# Patient Record
Sex: Female | Born: 1990
Health system: Southern US, Community
[De-identification: ages and names within clinical notes are randomized; demographics above are authoritative.]

## PROBLEM LIST (undated history)

## (undated) DIAGNOSIS — R011 Cardiac murmur, unspecified: Secondary | ICD-10-CM

## (undated) DIAGNOSIS — I4891 Unspecified atrial fibrillation: Secondary | ICD-10-CM

---
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
## 2011-01-17 LAB — CBC
Hemoglobin: 9.8 g/dL — ABNORMAL LOW (ref 12.0–16.0)
RBC: 4.89 MIL/uL (ref 3.80–5.70)
WBC: 7 10*3/uL (ref 4.5–13.5)

## 2011-01-17 LAB — RAPID URINE DRUG SCREEN, HOSP PERFORMED
Amphetamines: NOT DETECTED
Benzodiazepines: NOT DETECTED

## 2011-01-17 LAB — URINALYSIS, ROUTINE W REFLEX MICROSCOPIC
Hgb urine dipstick: NEGATIVE
Specific Gravity, Urine: 1.013 (ref 1.005–1.030)
Urobilinogen, UA: 1 mg/dL (ref 0.0–1.0)
pH: 6.5 (ref 5.0–8.0)

## 2011-01-17 LAB — BASIC METABOLIC PANEL
Calcium: 9.3 mg/dL (ref 8.4–10.5)
Chloride: 103 mEq/L (ref 96–112)
Creatinine, Ser: 0.66 mg/dL (ref 0.4–1.2)
Sodium: 137 mEq/L (ref 135–145)

## 2011-01-17 LAB — DIFFERENTIAL
Lymphs Abs: 1.9 10*3/uL (ref 1.1–4.8)
Monocytes Relative: 6 % (ref 3–11)
Neutro Abs: 4.6 10*3/uL (ref 1.7–8.0)
Neutrophils Relative %: 66 % (ref 43–71)

## 2011-01-17 LAB — ETHANOL: Alcohol, Ethyl (B): <5 mg/dL (ref 0–10)

## 2011-01-17 LAB — URINE MICROSCOPIC-ADD ON

## 2011-01-17 LAB — PREGNANCY, URINE: Preg Test, Ur: POSITIVE

## 2015-05-10 ENCOUNTER — Emergency Department (HOSPITAL_COMMUNITY)
Admission: EM | Admit: 2015-05-10 | Discharge: 2015-05-10 | Disposition: A | Discharge status: Home / Self Care | Payer: Medicaid Other | Attending: Emergency Medicine | Admitting: Emergency Medicine

## 2015-05-10 ENCOUNTER — Encounter (HOSPITAL_COMMUNITY): Payer: Self-pay | Admitting: Nurse Practitioner

## 2015-05-10 DIAGNOSIS — R509 Fever, unspecified: Secondary | ICD-10-CM | POA: Insufficient documentation

## 2015-05-10 DIAGNOSIS — Z720 Tobacco use: Secondary | ICD-10-CM | POA: Insufficient documentation

## 2015-05-10 DIAGNOSIS — K0889 Other specified disorders of teeth and supporting structures: Secondary | ICD-10-CM

## 2015-05-10 DIAGNOSIS — K029 Dental caries, unspecified: Secondary | ICD-10-CM | POA: Insufficient documentation

## 2015-05-10 DIAGNOSIS — Z87828 Personal history of other (healed) physical injury and trauma: Secondary | ICD-10-CM | POA: Insufficient documentation

## 2015-05-10 DIAGNOSIS — Z8679 Personal history of other diseases of the circulatory system: Secondary | ICD-10-CM | POA: Diagnosis not present

## 2015-05-10 DIAGNOSIS — R110 Nausea: Secondary | ICD-10-CM | POA: Insufficient documentation

## 2015-05-10 DIAGNOSIS — R011 Cardiac murmur, unspecified: Secondary | ICD-10-CM | POA: Diagnosis not present

## 2015-05-10 DIAGNOSIS — K088 Other specified disorders of teeth and supporting structures: Secondary | ICD-10-CM | POA: Insufficient documentation

## 2015-05-10 HISTORY — DX: Unspecified atrial fibrillation: I48.91

## 2015-05-10 HISTORY — DX: Cardiac murmur, unspecified: R01.1

## 2015-05-10 MED ORDER — IBUPROFEN 800 MG PO TABS: 800.0000 mg | ORAL_TABLET | Freq: Three times a day (TID) | ORAL | Status: DC

## 2015-05-10 MED ORDER — PENICILLIN V POTASSIUM 500 MG PO TABS: 500.0000 mg | ORAL_TABLET | Freq: Four times a day (QID) | ORAL | Status: AC

## 2015-05-10 NOTE — ED Provider Notes (Signed)
CSN: [id number]     Arrival date & time 05/10/15  1209 History  This chart was scribed for non-physician practitioner, Jinny Sanders, PA-C working with Nelva Nay, MD by Gwenyth Ober, ED scribe. This patient was seen in room TR05C/TR05C and the patient's care was started at 1:56 PM   Chief Complaint  Patient presents with  . Dental Pain   The history is provided by the patient. No language interpreter was used.   HPI Comments: Anne Jones is a 24 y.o. female who presents to the Emergency Department complaining of constant, moderate, gradually worsening right upper molar pain that started 1 week ago. Pt states swelling of her right face, fever of 100 and nausea as associated symptoms. She reports pain becomes worse with closing her mouth. Pt has tried Tylenol, Midol and Ibuprofen with no relief. Pt has a history of fracture of that same tooth. She has never been to a dentist. Pt denies vomiting.  Past Medical History  Diagnosis Date  . Atrial fibrillation   . Heart murmur    History reviewed. No pertinent past surgical history. History reviewed. No pertinent family history. History  Substance Use Topics  . Smoking status: Current Every Day Smoker    Types: Cigarettes  . Smokeless tobacco: Not on file  . Alcohol Use: Yes   OB History    No data available     Review of Systems  Constitutional: Positive for fever.  HENT: Positive for dental problem and facial swelling.   Gastrointestinal: Positive for nausea. Negative for vomiting.    Allergies  Review of patient's allergies indicates no known allergies.  Home Medications   Prior to Admission medications   Medication Sig Start Date End Date Taking? Authorizing Provider  ibuprofen (ADVIL,MOTRIN) 800 MG tablet Take 1 tablet (800 mg total) by mouth 3 (three) times daily. 05/10/15   Ladona Mow, PA-C  penicillin v potassium (VEETID) 500 MG tablet Take 1 tablet (500 mg total) by mouth 4 (four) times daily. 05/10/15 05/17/15   Ladona Mow, PA-C   BP 113/60 mmHg  Pulse 71  Temp(Src) 98.4 F (36.9 C) (Oral)  Resp 18  Ht 5\' 4"  (1.626 m)  Wt 213 lb 4.8 oz (96.752 kg)  BMI 36.59 kg/m2  SpO2 100% Physical Exam  Constitutional: She appears well-developed and well-nourished. No distress.  HENT:  Head: Normocephalic and atraumatic.  Mouth/Throat: Uvula is midline, oropharynx is clear and moist and mucous membranes are normal. No trismus in the jaw. Dental caries present. No dental abscesses. No oropharyngeal exudate, posterior oropharyngeal edema, posterior oropharyngeal erythema or tonsillar abscesses.  Upper rear right molar with dental caries; no obvious abscess; gumline intact; floor of mouth is soft. Mild anterior cervical lymphadenopathy.   Eyes: Conjunctivae and EOM are normal.  Neck: Normal range of motion and full passive range of motion without pain. Neck supple. No spinous process tenderness and no muscular tenderness present. No rigidity. No tracheal deviation, no edema, no erythema and normal range of motion present. No Brudzinski's sign and no Kernig's sign noted.  Cardiovascular: Normal rate.   Pulmonary/Chest: Effort normal. No respiratory distress.  Lymphadenopathy:    She has cervical adenopathy (mild).  Skin: Skin is warm and dry.  Psychiatric: She has a normal mood and affect. Her behavior is normal.  Nursing note and vitals reviewed.   ED Course  Dental Date/Time: 05/10/2015 5:21 PM Performed by: Ladona Mow Authorized by: Ladona Mow Consent: Verbal consent obtained. Risks and benefits: risks, benefits and alternatives were  discussed Consent given by: patient Patient identity confirmed: verbally with patient Time out: Immediately prior to procedure a "time out" was called to verify the correct patient, procedure, equipment, support staff and site/side marked as required. Preparation: Patient was prepped and draped in the usual sterile fashion. Local anesthesia used: yes Local anesthetic:  bupivacaine 0.5% with epinephrine Anesthetic total: 1.8 ml Patient sedated: no Patient tolerance: Patient tolerated the procedure well with no immediate complications Comments: Dental block to posterior upper right molar. Local anesthesia was used to anesthetize the nerve root directly over the rear molar. Patient experienced immediate relief status post.     DIAGNOSTIC STUDIES: Oxygen Saturation is 100% on RA, normal by my interpretation.    COORDINATION OF CARE: 1:17 PM Discussed treatment plan with pt which includes dental block. Pt agreed to plan.   2:06 PM Performed dental block. Pt tolerated procedure well. Discussed discharge instructions including prescription for Motrin and antibiotic. No other concerns at this time.     Labs Review Labs Reviewed - No data to display  Imaging Review No results found.   EKG Interpretation None      MDM   Final diagnoses:  Pain, dental   Patient with toothache.  Patient afebrile, hemodynamically stable, well-appearing and in no acute distress. No gross abscess.  Exam unconcerning for Ludwig's angina or spread of infection.  Dental block as in procedure note. Will treat with penicillin and pain medicine.  Urged patient to follow-up with dentist.  Discussed return precautions with patient, patient verbalizes understanding and agreement of this plan.   I personally performed the services described in this documentation, which was scribed in my presence. The recorded information has been reviewed and is accurate.  BP 113/60 mmHg  Pulse 71  Temp(Src) 98.4 F (36.9 C) (Oral)  Resp 18  Ht  (1.626 m)  Wt 213 lb 4.8 oz (96.752 kg)  BMI 36.59 kg/m2  SpO2 100%  Signed,  Ladona Mow, PA-C 5:23 PM    Ladona Mow, PA-C 05/10/15 1723  Nelva Nay, MD 05/11/15 [phone]

## 2015-05-10 NOTE — ED Notes (Signed)
Pt c/o R upper dental pain x 6 days.she tried tylenol with no relief. She states she can not sleep due to pain.

## 2015-05-10 NOTE — Discharge Instructions (Signed)
Dental Pain °A tooth ache may be caused by cavities (tooth decay). Cavities expose the nerve of the tooth to air and hot or cold temperatures. It may come from an infection or abscess (also called a boil or furuncle) around your tooth. It is also often caused by dental caries (tooth decay). This causes the pain you are having. °DIAGNOSIS  °Your caregiver can diagnose this problem by exam. °TREATMENT  °· If caused by an infection, it may be treated with medications which kill germs (antibiotics) and pain medications as prescribed by your caregiver. Take medications as directed. °· Only take over-the-counter or prescription medicines for pain, discomfort, or fever as directed by your caregiver. °· Whether the tooth ache today is caused by infection or dental disease, you should see your dentist as soon as possible for further care. °SEEK MEDICAL CARE IF: °The exam and treatment you received today has been provided on an emergency basis only. This is not a substitute for complete medical or dental care. If your problem worsens or new problems (symptoms) appear, and you are unable to meet with your dentist, call or return to this location. °SEEK IMMEDIATE MEDICAL CARE IF:  °· You have a fever. °· You develop redness and swelling of your face, jaw, or neck. °· You are unable to open your mouth. °· You have severe pain uncontrolled by pain medicine. °MAKE SURE YOU:  °· Understand these instructions. °· Will watch your condition. °· Will get help right away if you are not doing well or get worse. °Document Released: 09/29/2005 Document Revised: 12/22/2011 Document Reviewed: 05/17/2008 °ExitCare® Patient Information ©2015 [employer]. This information is not intended to replace advice given to you by your health care provider. Make sure you discuss any questions you have with your health care provider. ° °Emergency Department Resource Guide °1) Find a Doctor and Pay Out of Pocket °Although you won't have to find out who  is covered by your insurance plan, it is a good idea to ask around and get recommendations. You will then need to call the office and see if the doctor you have chosen will accept you as a new patient and what types of options they offer for patients who are self-pay. Some doctors offer discounts or will set up payment plans for their patients who do not have insurance, but you will need to ask so you aren't surprised when you get to your appointment. ° °2) Contact Your Local Health Department °Not all health departments have doctors that can see patients for sick visits, but many do, so it is worth a call to see if yours does. If you don't know where your local health department is, you can check in your phone book. The CDC also has a tool to help you locate your state's health department, and many state websites also have listings of all of their local health departments. ° °3) Find a Walk-in Clinic °If your illness is not likely to be very severe or complicated, you may want to try a walk in clinic. These are popping up all over the country in pharmacies, drugstores, and shopping centers. They're usually staffed by nurse practitioners or physician assistants that have been trained to treat common illnesses and complaints. They're usually fairly quick and inexpensive. However, if you have serious medical issues or chronic medical problems, these are probably not your best option. ° °No Primary Care Doctor: °- Call [facility] at  832-8000 - they can help you locate a primary   care doctor that  accepts your insurance, provides certain services, etc. °- Physician Referral Service- 1-800-533-3463 ° °Chronic Pain Problems: °Organization         Address  Phone   Notes  °[facility]  (336) 297-2271 Patients need to be referred by their primary care doctor.  ° °Medication Assistance: °Organization         Address  Phone   Notes  °[place] [facility] [address]  [address] °[place], [place] [address] (336) 641-8030 --Must be a resident of [place] °-- Must have NO insurance coverage whatsoever (no Medicaid/ Medicare, etc.) °-- The pt. MUST have a primary care doctor that directs their care regularly and follows them in the community °  °[facility]  (866) 331-1348   °[employer]  (888) 892-1162   ° °Agencies that provide inexpensive medical care: °Organization         Address  Phone   Notes  °[facility]  (336) 832-8035   °[facility]    (336) 832-7272   °[facility] [address] °[place], [place] [address] (336) 832-4777   °[facility] [address], °[place] (336) 271-4999   °[facility]    (336) 373-0678   °[facility]    (336) 272-1050   °[facility] ° [address], [place] Phone:  (336) 832-4444, Fax:  (336) 832-4440 Hours of Operation:  9 am - 6 pm, M-F.  Also accepts Medicaid/Medicare and self-pay.  °[facility] ° [address], [place] Phone: (336) 832-3150, Fax: (336) 832-3151. Hours of Operation:  8:30 am - 5:30 pm, M-F.  Also accepts Medicaid and self-pay.  °[facility] [place] [address], [place] Phone: (336) 878-6027   °[facility] [address], [place], [place] (336)723-1848, Ext. 123 Mondays & Thursdays: 7-9 AM.  First 15 patients are seen on a first come, first serve basis. °  ° °Medicaid-accepting [place] Providers: ° °Organization         Address  Phone   Notes  °[facility] [address], [place] (336) 641-2100 Also accepts self-pay patients.  °[facility] [address], [place] ° (336) 856-9996   °[facility] [address], [place] (336) 288-8857   °[facility] [address] [place] [address], [place] (336) 299-7000   °Veita Bland [address], [place]  ° (336) 373-1557 Only accepts  Access Medicaid patients after they have their name applied to their card.  ° °Self-Pay (no insurance) in [place]: ° °Organization         Address  Phone   Notes  °Sickle Cell Patients, [facility] [address], [place] (336) 832-1970   °[facility] [address], [place] (336) 832-4400   °[facility] ° [address], [place] (336) 992-4800   °[facility]/Dr. Osei-Bonsu ° [address] [place] [address], [place] or [address], [place] (336) [phone] Phone number for both [place] and [place] locations is the same.  °[facility] [address], [place] (336) 299-0000   °[facility] [address] [place] [address],  or [address] (336) 852-7530 °(336) 878-2260   °  [facility] [address], [place] (336) 350-1642, phone; (336) 294-5005, fax Sees patients 1st and 3rd Saturday of every month.  Must not qualify for public or private insurance (i.e. Medicaid, Medicare, [place] Health Choice, Veterans' Benefits) • Household income should be no more than 200% of the poverty level •The clinic cannot treat you if you are pregnant or think you are pregnant • Sexually transmitted diseases are not treated at the clinic.  ° ° °Dental Care: °Organization         Address  Phone  Notes  °[facility] [address], [place] (336) 641-6152 Accepts children up to age 21 who are enrolled in Medicaid or [place] Health Choice; pregnant women with a Medicaid card; and children who have applied for Medicaid or [place] Health Choice, but were declined, whose parents can pay a reduced fee at time of service.  °[facility] [place]  [address], [place] (336) 641-7733 Accepts children up to age 21 who are  enrolled in Medicaid or [place] Health Choice; pregnant women with a Medicaid card; and children who have applied for Medicaid or [place] Health Choice, but were declined, whose parents can pay a reduced fee at time of service.  °[facility] ° [address], [place] (336) 641-4533 Patients are seen by appointment only. Walk-ins are not accepted. [facility] will see patients 18 years of age and older. °Monday - Tuesday (8am-5pm) °Most Wednesdays (8:30-5pm) °$30 per visit, cash only  °[facility] ° [address], [place] (336) 641-4533 Patients are seen by appointment only. Walk-ins are not accepted. [facility] will see patients 18 years of age and older. °One Wednesday Evening (Monthly: Volunteer Based).  $30 per visit, cash only  °[facility]  (919) 537-3737 for adults; Children under age 4, call Graduate Pediatric Dentistry at ([phone]) [phone]. Children aged 4-14, please call (919) [phone] to request a pediatric application. ° Dental services are provided in all areas of dental care including fillings, crowns and bridges, complete and partial dentures, implants, gum treatment, root canals, and extractions. Preventive care is also provided. Treatment is provided to both adults and children. °Patients are selected via a lottery and there is often a waiting list. °  °[facility] [address], °[place] ° (336) 763-8833 [email or web address] °  °[facility] [address], [place], [place] (336)723-1848, Ext. 123 Second and Fourth Thursday of each month, opens at 6:30 AM; Clinic ends at 9 AM.  Patients are seen on a first-come first-served basis, and a limited number are seen during each clinic.  ° °[facility] ° [address], [place], [place] (336) 723-7904   Eligibility Requirements °You must have lived in [place], or [place] counties for at least the last three months. °  You  cannot be eligible for state or federal sponsored healthcare insurance, including Veterans Administration, Medicaid, or Medicare. °  You generally cannot be eligible for healthcare insurance through your employer.  °  How to apply: °Eligibility screenings are held every Tuesday and Wednesday afternoon from 1:00 pm until 4:00 pm. You do not need an appointment for the interview!  °[facility] [address], [place], [place] 336-631-2330   °[facility]  336-342-8273   °[facility]  336-703-3100   °[facility]  336-570-6415   ° °Behavioral Health Resources in the Community: °Intensive Outpatient Programs °Organization         Address  Phone  Notes  °[facility] [address], [place], [place] 336-878-6098   °[facility] [address], [place], [place] 336-832-9800   °[facility] [address], [place], [place] ° 336-882-2125   °[facility] [address],  °[place], [place] 1-800-853-5163 or 336-641-4981   °Substance Abuse Resources °Organization         Address  Phone  Notes  °[facility]  336-882-2125   °[facility]  336-784-9470   °[facility]  336-285-9073   °[facility]  336-845-3988   °Residential & Outpatient Substance Abuse Program  1-800-659-3381   °Psychological Services °Organization         Address  Phone  Notes  °[facility]  336- 832-9600   °[facility]  336- 378-7881   °[facility] [address], [place] 1-800-853-5163 or 336-641-4981   ° °Mobile Crisis Teams °Organization         Address  Phone  Notes  °[facility], Mobile Crisis Care Unit  1-877-626-1772   °Assertive °[facility] ° [address] [place], [place] 336-834-9664   °Sharon DeEsch [address] °[place] 336-554-5454   ° °Self-Help/Support  Groups °Organization         Address  Phone             Notes  °[facility] - variety of support groups  336- 373-1402 Call for more information  °Narcotics Anonymous (NA), [facility] [address], °[place]  2 meetings at this location  ° °Residential Treatment Programs °Organization         Address  Phone  Notes  °[facility] [address],    °[place]  1-866-801-8205   °[facility] ° [address], [place], [place] 704-293-8524   °[facility] [address], [place] 336-845-3988 Admissions: 8am-3pm M-F  °[facility] [address]    °[place], [place] 336-841-1104   °[facility] [address], [place], [place] 336-379-7146   °[facility] [address]  °[place], [place] 336-285-9073   °[facility] Intensive Outpatient [address], [place], [place] 336-852-3033   °[facility] [address]  °[place], [place] 1-877-615-2722 or 336-784-9470   °[facility] [address] [place], [place] 336-227-7417 Accepts Medicaid  °[facility] [address]  ° [place] 1-800-659-3381 Substance Abuse/Addiction Treatment  ° °[place] Behavioral Health Resources °Organization         Address  Phone  Notes  °[facility]  (888) 581-9988   °Julie Brannon, PhD [address] [place], [place]   (336) 349-5553 or (336) 951-0000   °[facility]   [address] °[place], [place] (336) 349-4454   °[facility] [address], [place],  (336) 342-8316 Insurance/Medicaid/sponsorship through [facility]  °[facility] [address]                                    [place],  (336) 342-8316 Therapy/tele-psych/case  °[facility]   [address]  ° [place], [place] (336) 349-2233    °Dr. Arfeen  (336) 349-4544   °[facility] [place]  [employer] [facility] [place] [facility] 1) [address], [place] °2) [address], [place] °3)  [address], [place] (336) 349-3220 °(336) 342-7768 ° °(336) 342-8140   °[place] [facility] (336) 342-1394 or (336) 342-3537 (After Hours)    ° ° ° °

## 2019-02-12 ENCOUNTER — Inpatient Hospital Stay (HOSPITAL_COMMUNITY)
Admission: EM | Admit: 2019-02-12 | Discharge: 2019-02-15 | DRG: 517 | Disposition: A | Discharge status: Home Health | Payer: Self-pay | Attending: Orthopedic Surgery | Admitting: Orthopedic Surgery

## 2019-02-12 ENCOUNTER — Emergency Department (HOSPITAL_COMMUNITY): Payer: Self-pay

## 2019-02-12 ENCOUNTER — Other Ambulatory Visit: Payer: Self-pay

## 2019-02-12 DIAGNOSIS — S32592A Other specified fracture of left pubis, initial encounter for closed fracture: Secondary | ICD-10-CM

## 2019-02-12 DIAGNOSIS — Z791 Long term (current) use of non-steroidal anti-inflammatories (NSAID): Secondary | ICD-10-CM

## 2019-02-12 DIAGNOSIS — S32511A Fracture of superior rim of right pubis, initial encounter for closed fracture: Secondary | ICD-10-CM

## 2019-02-12 DIAGNOSIS — Y92411 Interstate highway as the place of occurrence of the external cause: Secondary | ICD-10-CM

## 2019-02-12 DIAGNOSIS — Z419 Encounter for procedure for purposes other than remedying health state, unspecified: Secondary | ICD-10-CM

## 2019-02-12 DIAGNOSIS — T148XXA Other injury of unspecified body region, initial encounter: Secondary | ICD-10-CM

## 2019-02-12 DIAGNOSIS — S329XXA Fracture of unspecified parts of lumbosacral spine and pelvis, initial encounter for closed fracture: Secondary | ICD-10-CM | POA: Diagnosis present

## 2019-02-12 DIAGNOSIS — S32811A Multiple fractures of pelvis with unstable disruption of pelvic ring, initial encounter for closed fracture: Principal | ICD-10-CM | POA: Diagnosis present

## 2019-02-12 DIAGNOSIS — S32810A Multiple fractures of pelvis with stable disruption of pelvic ring, initial encounter for closed fracture: Secondary | ICD-10-CM

## 2019-02-12 DIAGNOSIS — S32512A Fracture of superior rim of left pubis, initial encounter for closed fracture: Secondary | ICD-10-CM

## 2019-02-12 DIAGNOSIS — F17210 Nicotine dependence, cigarettes, uncomplicated: Secondary | ICD-10-CM | POA: Diagnosis present

## 2019-02-12 DIAGNOSIS — Z20828 Contact with and (suspected) exposure to other viral communicable diseases: Secondary | ICD-10-CM

## 2019-02-12 DIAGNOSIS — F1290 Cannabis use, unspecified, uncomplicated: Secondary | ICD-10-CM | POA: Diagnosis present

## 2019-02-12 DIAGNOSIS — F4310 Post-traumatic stress disorder, unspecified: Secondary | ICD-10-CM | POA: Diagnosis present

## 2019-02-12 DIAGNOSIS — Z1159 Encounter for screening for other viral diseases: Secondary | ICD-10-CM

## 2019-02-12 LAB — COMPREHENSIVE METABOLIC PANEL
ALT: 30 U/L (ref 0–44)
AST: 75 U/L — ABNORMAL HIGH (ref 15–41)
Albumin: 4.1 g/dL (ref 3.5–5.0)
Alkaline Phosphatase: 65 U/L (ref 38–126)
Anion gap: 9 (ref 5–15)
BUN: 6 mg/dL (ref 6–20)
CO2: 25 mmol/L (ref 22–32)
Calcium: 9.3 mg/dL (ref 8.9–10.3)
Chloride: 100 mmol/L (ref 98–111)
Creatinine, Ser: 1.36 mg/dL — ABNORMAL HIGH (ref 0.44–1.00)
GFR calc Af Amer: >60 mL/min (ref >60)
GFR calc non Af Amer: 54 mL/min — ABNORMAL LOW (ref >60)
Glucose, Bld: 117 mg/dL — ABNORMAL HIGH (ref 70–99)
Potassium: 3.2 mmol/L — ABNORMAL LOW (ref 3.5–5.1)
Sodium: 134 mmol/L — ABNORMAL LOW (ref 135–145)
Total Bilirubin: 0.7 mg/dL (ref 0.3–1.2)
Total Protein: 7.3 g/dL (ref 6.5–8.1)

## 2019-02-12 LAB — CBC WITH DIFFERENTIAL/PLATELET
Abs Immature Granulocytes: 0.21 10*3/uL — ABNORMAL HIGH (ref 0.00–0.07)
Basophils Absolute: 0.1 10*3/uL (ref 0.0–0.1)
Basophils Relative: 1 %
Eosinophils Absolute: 0.1 10*3/uL (ref 0.0–0.5)
Eosinophils Relative: 1 %
HCT: 42.4 % (ref 36.0–46.0)
Hemoglobin: 14.5 g/dL (ref 12.0–15.0)
Immature Granulocytes: 2 %
Lymphocytes Relative: 30 %
Lymphs Abs: 4.3 10*3/uL — ABNORMAL HIGH (ref 0.7–4.0)
MCH: 28.8 pg (ref 26.0–34.0)
MCHC: 34.2 g/dL (ref 30.0–36.0)
MCV: 84.3 fL (ref 80.0–100.0)
Monocytes Absolute: 0.8 10*3/uL (ref 0.1–1.0)
Monocytes Relative: 6 %
Neutro Abs: 8.7 10*3/uL — ABNORMAL HIGH (ref 1.7–7.7)
Neutrophils Relative %: 60 %
Platelets: 239 10*3/uL (ref 150–400)
RBC: 5.03 MIL/uL (ref 3.87–5.11)
RDW: 12.4 % (ref 11.5–15.5)
WBC: 14.2 10*3/uL — ABNORMAL HIGH (ref 4.0–10.5)
nRBC: 0 % (ref 0.0–0.2)

## 2019-02-12 LAB — I-STAT BETA HCG BLOOD, ED (MC, WL, AP ONLY): I-stat hCG, quantitative: <5 m[IU]/mL (ref <5)

## 2019-02-12 LAB — SARS CORONAVIRUS 2 BY RT PCR (HOSPITAL ORDER, PERFORMED IN ~~LOC~~ HOSPITAL LAB): SARS Coronavirus 2: NEGATIVE
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
## 2019-02-12 MED ORDER — MORPHINE SULFATE (PF) 4 MG/ML IV SOLN
4.0000 mg | Freq: Once | INTRAVENOUS | Status: AC
Start: 2019-02-12 — End: 2019-02-12
  Administered 2019-02-12: 4 mg via INTRAVENOUS
  Filled 2019-02-12: qty 1

## 2019-02-12 MED ORDER — LORAZEPAM 2 MG/ML IJ SOLN
1.0000 mg | Freq: Once | INTRAMUSCULAR | Status: AC
  Administered 2019-02-12: 1 mg via INTRAVENOUS

## 2019-02-12 MED ORDER — HYDROMORPHONE HCL 1 MG/ML IJ SOLN
1.0000 mg | INTRAMUSCULAR | Status: DC | PRN
  Administered 2019-02-12 – 2019-02-15 (×12): 1 mg via INTRAVENOUS
  Filled 2019-02-12 (×13): qty 1

## 2019-02-12 MED ORDER — FENTANYL CITRATE (PF) 100 MCG/2ML IJ SOLN
50.0000 ug | Freq: Once | INTRAMUSCULAR | Status: AC
  Administered 2019-02-12: 50 ug via INTRAVENOUS
  Filled 2019-02-12: qty 2

## 2019-02-12 MED ORDER — ALPRAZOLAM 0.5 MG PO TABS
0.5000 mg | ORAL_TABLET | Freq: Three times a day (TID) | ORAL | Status: DC | PRN
  Administered 2019-02-12 – 2019-02-14 (×4): 0.5 mg via ORAL
  Filled 2019-02-12 (×4): qty 1
  Filled 2019-02-12: qty 2
  Filled 2019-02-12: qty 1

## 2019-02-12 MED ORDER — ONDANSETRON HCL 4 MG/2ML IJ SOLN
4.0000 mg | Freq: Once | INTRAMUSCULAR | Status: AC
  Administered 2019-02-12: 4 mg via INTRAVENOUS
  Filled 2019-02-12: qty 2

## 2019-02-12 MED ORDER — OXYCODONE HCL 5 MG PO TABS
10.0000 mg | ORAL_TABLET | ORAL | Status: DC | PRN
  Administered 2019-02-13 – 2019-02-15 (×9): 10 mg via ORAL
  Filled 2019-02-12 (×9): qty 2

## 2019-02-12 MED ORDER — LORAZEPAM 2 MG/ML IJ SOLN
INTRAMUSCULAR | Status: AC
  Administered 2019-02-12: 1 mg via INTRAVENOUS
  Filled 2019-02-12: qty 1

## 2019-02-12 MED ORDER — SILVER SULFADIAZINE 1 % EX CREA
TOPICAL_CREAM | Freq: Once | CUTANEOUS | Status: AC
  Administered 2019-02-12: 1 via TOPICAL
  Filled 2019-02-12: qty 85

## 2019-02-12 MED ORDER — IOHEXOL 300 MG/ML  SOLN
100.0000 mL | Freq: Once | INTRAMUSCULAR | Status: AC | PRN
  Administered 2019-02-12: 100 mL via INTRAVENOUS

## 2019-02-12 MED ORDER — LORAZEPAM 2 MG/ML IJ SOLN
1.0000 mg | Freq: Once | INTRAMUSCULAR | Status: AC
  Administered 2019-02-12: 1 mg via INTRAVENOUS
  Filled 2019-02-12: qty 1

## 2019-02-12 MED ORDER — OXYCODONE HCL 5 MG PO TABS: 5.0000 mg | ORAL_TABLET | ORAL | Status: DC | PRN

## 2019-02-12 MED ORDER — ONDANSETRON HCL 4 MG/2ML IJ SOLN: 4.0000 mg | Freq: Four times a day (QID) | INTRAMUSCULAR | Status: DC | PRN

## 2019-02-12 MED ORDER — ONDANSETRON 4 MG PO TBDP
4.0000 mg | ORAL_TABLET | Freq: Four times a day (QID) | ORAL | Status: DC | PRN
  Administered 2019-02-14: 4 mg via ORAL
  Filled 2019-02-12: qty 1

## 2019-02-12 NOTE — ED Notes (Signed)
Ortho MD at bedside.
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
## 2019-02-12 NOTE — ED Notes (Signed)
Anne Jones husband - [phone]
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
## 2019-02-12 NOTE — ED Notes (Signed)
RN cleaned head lac and covered with bacitracin
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
## 2019-02-12 NOTE — ED Notes (Signed)
C-collar removed, CT was clear

## 2019-02-13 ENCOUNTER — Inpatient Hospital Stay (HOSPITAL_COMMUNITY): Payer: Self-pay

## 2019-02-13 LAB — HIV ANTIBODY (ROUTINE TESTING W REFLEX): HIV Screen 4th Generation wRfx: NONREACTIVE

## 2019-02-13 MED ORDER — NICOTINE 14 MG/24HR TD PT24
14.0000 mg | MEDICATED_PATCH | Freq: Every day | TRANSDERMAL | Status: DC
  Administered 2019-02-13 – 2019-02-15 (×3): 14 mg via TRANSDERMAL
  Filled 2019-02-13 (×3): qty 1

## 2019-02-13 MED ORDER — ENOXAPARIN SODIUM 40 MG/0.4ML ~~LOC~~ SOLN
40.0000 mg | SUBCUTANEOUS | Status: DC
  Administered 2019-02-13 – 2019-02-15 (×2): 40 mg via SUBCUTANEOUS
  Filled 2019-02-13 (×3): qty 0.4

## 2019-02-13 MED ORDER — BACITRACIN ZINC 500 UNIT/GM EX OINT
TOPICAL_OINTMENT | Freq: Two times a day (BID) | CUTANEOUS | Status: DC
  Administered 2019-02-13 – 2019-02-14 (×4): via TOPICAL
  Administered 2019-02-14: 1 via TOPICAL
  Administered 2019-02-15: 09:00:00 via TOPICAL
  Filled 2019-02-13 (×2): qty 28.35

## 2019-02-13 MED ORDER — POTASSIUM CHLORIDE IN NACL 20-0.9 MEQ/L-% IV SOLN
INTRAVENOUS | Status: DC
  Administered 2019-02-13 – 2019-02-14 (×5): via INTRAVENOUS
  Filled 2019-02-13 (×5): qty 1000
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
## 2019-02-14 ENCOUNTER — Encounter (HOSPITAL_COMMUNITY): Admission: EM | Disposition: A | Discharge status: Home Health | Payer: Self-pay | Source: Home / Self Care

## 2019-02-14 ENCOUNTER — Inpatient Hospital Stay (HOSPITAL_COMMUNITY): Payer: Self-pay

## 2019-02-14 ENCOUNTER — Inpatient Hospital Stay (HOSPITAL_COMMUNITY): Payer: Self-pay | Admitting: Registered Nurse

## 2019-02-14 ENCOUNTER — Encounter (HOSPITAL_COMMUNITY): Payer: Self-pay | Admitting: *Deleted

## 2019-02-14 HISTORY — PX: ORIF PELVIC FRACTURE: SHX2128

## 2019-02-14 LAB — BASIC METABOLIC PANEL
Anion gap: 6 (ref 5–15)
BUN: 6 mg/dL (ref 6–20)
CO2: 21 mmol/L — ABNORMAL LOW (ref 22–32)
Calcium: 8.5 mg/dL — ABNORMAL LOW (ref 8.9–10.3)
Chloride: 108 mmol/L (ref 98–111)
Creatinine, Ser: 0.89 mg/dL (ref 0.44–1.00)
GFR calc Af Amer: >60 mL/min (ref >60)
GFR calc non Af Amer: >60 mL/min (ref >60)
Glucose, Bld: 103 mg/dL — ABNORMAL HIGH (ref 70–99)
Potassium: 4.3 mmol/L (ref 3.5–5.1)
Sodium: 135 mmol/L (ref 135–145)

## 2019-02-14 LAB — SURGICAL PCR SCREEN
MRSA, PCR: NEGATIVE
Staphylococcus aureus: POSITIVE — AB

## 2019-02-14 LAB — LACTIC ACID, PLASMA: Lactic Acid, Venous: 0.6 mmol/L (ref 0.5–1.9)

## 2019-02-14 LAB — CBC
HCT: 36.1 % (ref 36.0–46.0)
Hemoglobin: 12.3 g/dL (ref 12.0–15.0)
MCH: 29 pg (ref 26.0–34.0)
MCHC: 34.1 g/dL (ref 30.0–36.0)
MCV: 85.1 fL (ref 80.0–100.0)
Platelets: 127 10*3/uL — ABNORMAL LOW (ref 150–400)
RBC: 4.24 MIL/uL (ref 3.87–5.11)
RDW: 12.6 % (ref 11.5–15.5)
WBC: 7.3 10*3/uL (ref 4.0–10.5)
nRBC: 0 % (ref 0.0–0.2)

## 2019-02-14 LAB — RAPID URINE DRUG SCREEN, HOSP PERFORMED
Amphetamines: POSITIVE — AB
Barbiturates: NOT DETECTED
Benzodiazepines: POSITIVE — AB
Cocaine: NOT DETECTED
Opiates: POSITIVE — AB
Tetrahydrocannabinol: POSITIVE — AB

## 2019-02-14 SURGERY — OPEN REDUCTION INTERNAL FIXATION (ORIF) PELVIC FRACTURE
Anesthesia: General

## 2019-02-14 MED ORDER — LIDOCAINE 2% (20 MG/ML) 5 ML SYRINGE
INTRAMUSCULAR | Status: DC | PRN
  Administered 2019-02-14: 40 mg via INTRAVENOUS

## 2019-02-14 MED ORDER — CEFAZOLIN SODIUM-DEXTROSE 2-4 GM/100ML-% IV SOLN
2.0000 g | Freq: Three times a day (TID) | INTRAVENOUS | Status: AC
  Administered 2019-02-14 – 2019-02-15 (×3): 2 g via INTRAVENOUS
  Filled 2019-02-14 (×5): qty 100

## 2019-02-14 MED ORDER — ROCURONIUM BROMIDE 50 MG/5ML IV SOSY
PREFILLED_SYRINGE | INTRAVENOUS | Status: DC | PRN
  Administered 2019-02-14: 50 mg via INTRAVENOUS

## 2019-02-14 MED ORDER — ONDANSETRON HCL 4 MG/2ML IJ SOLN
INTRAMUSCULAR | Status: AC
  Filled 2019-02-14: qty 2

## 2019-02-14 MED ORDER — MIDAZOLAM HCL 2 MG/2ML IJ SOLN
INTRAMUSCULAR | Status: AC
  Filled 2019-02-14: qty 2

## 2019-02-14 MED ORDER — MIDAZOLAM HCL 5 MG/5ML IJ SOLN
INTRAMUSCULAR | Status: DC | PRN
  Administered 2019-02-14: 2 mg via INTRAVENOUS

## 2019-02-14 MED ORDER — VITAMIN C 500 MG PO TABS
500.0000 mg | ORAL_TABLET | Freq: Every day | ORAL | Status: DC
  Administered 2019-02-15: 500 mg via ORAL
  Filled 2019-02-14: qty 1

## 2019-02-14 MED ORDER — HYDROMORPHONE HCL 1 MG/ML IJ SOLN
0.2500 mg | INTRAMUSCULAR | Status: DC | PRN
  Administered 2019-02-14 (×2): 0.5 mg via INTRAVENOUS

## 2019-02-14 MED ORDER — MUPIROCIN 2 % EX OINT: TOPICAL_OINTMENT | Freq: Two times a day (BID) | CUTANEOUS | Status: DC

## 2019-02-14 MED ORDER — LACTATED RINGERS IV SOLN
INTRAVENOUS | Status: DC
  Administered 2019-02-14: 11:00:00 via INTRAVENOUS

## 2019-02-14 MED ORDER — LIDOCAINE 2% (20 MG/ML) 5 ML SYRINGE
INTRAMUSCULAR | Status: AC
  Filled 2019-02-14: qty 5

## 2019-02-14 MED ORDER — FENTANYL CITRATE (PF) 100 MCG/2ML IJ SOLN
INTRAMUSCULAR | Status: DC | PRN
  Administered 2019-02-14: 50 ug via INTRAVENOUS
  Administered 2019-02-14: 150 ug via INTRAVENOUS
  Administered 2019-02-14: 50 ug via INTRAVENOUS

## 2019-02-14 MED ORDER — CHLORHEXIDINE GLUCONATE CLOTH 2 % EX PADS
6.0000 | MEDICATED_PAD | Freq: Every day | CUTANEOUS | Status: DC
  Administered 2019-02-14 – 2019-02-15 (×2): 6 via TOPICAL

## 2019-02-14 MED ORDER — 0.9 % SODIUM CHLORIDE (POUR BTL) OPTIME
TOPICAL | Status: DC | PRN
  Administered 2019-02-14: 1000 mL

## 2019-02-14 MED ORDER — PHENYLEPHRINE 40 MCG/ML (10ML) SYRINGE FOR IV PUSH (FOR BLOOD PRESSURE SUPPORT)
PREFILLED_SYRINGE | INTRAVENOUS | Status: AC
  Filled 2019-02-14: qty 20

## 2019-02-14 MED ORDER — ROCURONIUM BROMIDE 10 MG/ML (PF) SYRINGE
PREFILLED_SYRINGE | INTRAVENOUS | Status: AC
  Filled 2019-02-14: qty 10

## 2019-02-14 MED ORDER — ONDANSETRON HCL 4 MG/2ML IJ SOLN
INTRAMUSCULAR | Status: DC | PRN
  Administered 2019-02-14: 4 mg via INTRAVENOUS

## 2019-02-14 MED ORDER — SUCCINYLCHOLINE CHLORIDE 200 MG/10ML IV SOSY
PREFILLED_SYRINGE | INTRAVENOUS | Status: AC
  Filled 2019-02-14: qty 10

## 2019-02-14 MED ORDER — HYDROMORPHONE HCL 1 MG/ML IJ SOLN
INTRAMUSCULAR | Status: AC
  Filled 2019-02-14: qty 1

## 2019-02-14 MED ORDER — PROPOFOL 10 MG/ML IV BOLUS
INTRAVENOUS | Status: DC | PRN
  Administered 2019-02-14: 140 mg via INTRAVENOUS

## 2019-02-14 MED ORDER — CEFAZOLIN SODIUM-DEXTROSE 2-3 GM-%(50ML) IV SOLR
INTRAVENOUS | Status: DC | PRN
  Administered 2019-02-14: 2 g via INTRAVENOUS

## 2019-02-14 MED ORDER — MUPIROCIN 2 % EX OINT
1.0000 "application " | TOPICAL_OINTMENT | Freq: Two times a day (BID) | CUTANEOUS | Status: DC
  Administered 2019-02-14 – 2019-02-15 (×3): 1 via NASAL
  Filled 2019-02-14: qty 22

## 2019-02-14 MED ORDER — FENTANYL CITRATE (PF) 250 MCG/5ML IJ SOLN
INTRAMUSCULAR | Status: AC
  Filled 2019-02-14: qty 5

## 2019-02-14 MED ORDER — DEXAMETHASONE SODIUM PHOSPHATE 10 MG/ML IJ SOLN
INTRAMUSCULAR | Status: AC
  Filled 2019-02-14: qty 1

## 2019-02-14 MED ORDER — DEXAMETHASONE SODIUM PHOSPHATE 10 MG/ML IJ SOLN
INTRAMUSCULAR | Status: DC | PRN
  Administered 2019-02-14: 5 mg via INTRAVENOUS

## 2019-02-14 MED ORDER — PHENYLEPHRINE 40 MCG/ML (10ML) SYRINGE FOR IV PUSH (FOR BLOOD PRESSURE SUPPORT)
PREFILLED_SYRINGE | INTRAVENOUS | Status: DC | PRN
  Administered 2019-02-14: 120 ug via INTRAVENOUS
  Administered 2019-02-14 (×2): 80 ug via INTRAVENOUS

## 2019-02-14 MED ORDER — SUCCINYLCHOLINE CHLORIDE 20 MG/ML IJ SOLN
INTRAMUSCULAR | Status: DC | PRN
  Administered 2019-02-14: 120 mg via INTRAVENOUS

## 2019-02-14 MED ORDER — SUGAMMADEX SODIUM 200 MG/2ML IV SOLN
INTRAVENOUS | Status: DC | PRN
  Administered 2019-02-14 (×2): 100 mg via INTRAVENOUS

## 2019-02-14 SURGICAL SUPPLY — 46 items
BLADE CLIPPER SURG (BLADE) IMPLANT
BRUSH SCRUB SURG 4.25 DISP (MISCELLANEOUS) ×6 IMPLANT
COVER WAND RF STERILE (DRAPES) ×3 IMPLANT
DRAIN CHANNEL 15F RND FF W/TCR (WOUND CARE) IMPLANT
DRAPE C-ARM 42X72 X-RAY (DRAPES) ×3 IMPLANT
DRAPE C-ARMOR (DRAPES) ×3 IMPLANT
DRAPE LAPAROTOMY TRNSV 102X78 (DRAPE) ×3 IMPLANT
DRAPE SURG 17X23 STRL (DRAPES) ×3 IMPLANT
DRAPE U-SHAPE 47X51 STRL (DRAPES) ×3 IMPLANT
DRSG MEPILEX BORDER 4X4 (GAUZE/BANDAGES/DRESSINGS) ×3 IMPLANT
ELECT REM PT RETURN 9FT ADLT (ELECTROSURGICAL) ×3
ELECTRODE REM PT RTRN 9FT ADLT (ELECTROSURGICAL) ×1 IMPLANT
EVACUATOR SILICONE 100CC (DRAIN) IMPLANT
GLOVE BIO SURGEON STRL SZ7.5 (GLOVE) ×3 IMPLANT
GLOVE BIO SURGEON STRL SZ8 (GLOVE) ×3 IMPLANT
GLOVE BIOGEL PI IND STRL 7.5 (GLOVE) ×1 IMPLANT
GLOVE BIOGEL PI IND STRL 8 (GLOVE) ×1 IMPLANT
GLOVE BIOGEL PI INDICATOR 7.5 (GLOVE) ×2
GLOVE BIOGEL PI INDICATOR 8 (GLOVE) ×2
GOWN STRL REUS W/ TWL LRG LVL3 (GOWN DISPOSABLE) ×2 IMPLANT
GOWN STRL REUS W/ TWL XL LVL3 (GOWN DISPOSABLE) ×1 IMPLANT
GOWN STRL REUS W/TWL LRG LVL3 (GOWN DISPOSABLE) ×4
GOWN STRL REUS W/TWL XL LVL3 (GOWN DISPOSABLE) ×2
GUIDEPIN 2.8X300 THRD TIP OIC (PIN) ×3 IMPLANT
KIT BASIN OR (CUSTOM PROCEDURE TRAY) ×3 IMPLANT
KIT TURNOVER KIT B (KITS) ×3 IMPLANT
MANIFOLD NEPTUNE II (INSTRUMENTS) ×3 IMPLANT
NS IRRIG 1000ML POUR BTL (IV SOLUTION) ×6 IMPLANT
PACK TOTAL JOINT (CUSTOM PROCEDURE TRAY) ×3 IMPLANT
PAD ARMBOARD 7.5X6 YLW CONV (MISCELLANEOUS) ×6 IMPLANT
SCREW CANN 7.3X90 16MM THRD (Screw) ×3 IMPLANT
SPONGE LAP 18X18 RF (DISPOSABLE) IMPLANT
STAPLER VISISTAT 35W (STAPLE) ×3 IMPLANT
SUCTION FRAZIER HANDLE 10FR (MISCELLANEOUS) ×2
SUCTION TUBE FRAZIER 10FR DISP (MISCELLANEOUS) ×1 IMPLANT
SUT VIC AB 0 CT1 27 (SUTURE) ×4
SUT VIC AB 0 CT1 27XBRD ANBCTR (SUTURE) ×2 IMPLANT
SUT VIC AB 1 CT1 18XCR BRD 8 (SUTURE) ×2 IMPLANT
SUT VIC AB 1 CT1 8-18 (SUTURE) ×4
SUT VIC AB 2-0 CT1 27 (SUTURE) ×4
SUT VIC AB 2-0 CT1 TAPERPNT 27 (SUTURE) ×2 IMPLANT
TOWEL OR 17X24 6PK STRL BLUE (TOWEL DISPOSABLE) ×3 IMPLANT
TOWEL OR 17X26 10 PK STRL BLUE (TOWEL DISPOSABLE) ×6 IMPLANT
TRAY FOLEY MTR SLVR 16FR STAT (SET/KITS/TRAYS/PACK) IMPLANT
WASHER OIC 13MM 6 PACK (Screw) ×3 IMPLANT
WATER STERILE IRR 1000ML POUR (IV SOLUTION) ×12 IMPLANT

## 2019-02-14 NOTE — Transfer of Care (Signed)
Immediate Anesthesia Transfer of Care Note  Patient: Anne Jones  Procedure(s) Performed: OPEN REDUCTION INTERNAL FIXATION (ORIF) PELVIC FRACTURE (N/A )  Patient Location: PACU  Anesthesia Type:General  Level of Consciousness: awake, alert  and oriented  Airway & Oxygen Therapy: Patient Spontanous Breathing and Patient connected to nasal cannula oxygen  Post-op Assessment: Report given to RN and Post -op Vital signs reviewed and stable  Post vital signs: Reviewed and stable  Last Vitals:  Vitals Value Taken Time  BP    Temp    Pulse    Resp    SpO2      Last Pain:  Vitals:   02/14/19 1059  TempSrc:   PainSc: 7       Patients Stated Pain Goal: 2 (02/14/19 1059)  Complications: No apparent anesthesia complications

## 2019-02-14 NOTE — Progress Notes (Signed)
PT Cancellation Note  Patient Details Name: Anne Jones MRN: [id number] DOB: [birth year]   Cancelled Treatment:     Patient off the floor for ORIF of the pelvis. Therapy will follow up pending MD orders.    Dessie Coma PT DPT  02/14/2019, 1:40 PM

## 2019-02-14 NOTE — Op Note (Signed)
02/14/2019 1:29 PM  PATIENT:  Anne Jones  PRE-OPERATIVE DIAGNOSIS:  UNSTABLE PELVIC RING FRACTURE  POST-OPERATIVE DIAGNOSIS:  UNSTABLE PELVIC RING FRACTURE  PROCEDURE:  Procedure(s): PERCUTANEOUS SACRO-ILIAC SCREW FIXATION LEFT OF THE POSTERIOR PELVIC RING  SURGEON:  Surgeon(s) and Role:    Myrene Galas, MD - Primary  ASSISTANTS: RNFA  ANESTHESIA:   general  EBL:  15 mL   BLOOD ADMINISTERED:none  DRAINS: none   LOCAL MEDICATIONS USED:  NONE  SPECIMEN:  No Specimen  DISPOSITION OF SPECIMEN:  N/A  COUNTS:  YES  TOURNIQUET:  * No tourniquets in log *  DICTATION: .Note written in EPIC  PLAN OF CARE: Admit to inpatient   PATIENT DISPOSITION:  PACU - hemodynamically stable.   Delay start of Pharmacological VTE agent (>24hrs) due to surgical blood loss or risk of bleeding: no  BRIEF SUMMARY AND INDICATIONS FOR PROCEDURE:  Patient sustained an unstable pelvic ring injury. Because of the nature and magnitude of these injuries, Dr. Lorretta Harp deemed further management outside his scope of practice and asserted these injuries would be best managed by fellowship trained orthopedic traumatologist.  Consequently, I was consulted to assume management.  We discussed with the risks and benefits of the surgery including nonunion,, malunion, arthritis, loss of fixation, sexual dysfunction, pain, nerve injury, vessel injury, DVT, PE, and others.  We also specifically discussed urologic injury and footdrop. After full discussion, consent obtained and decision made to proceed.   BRIEF SUMMARY OF PROCEDURE:  The patient was taken to the operating room, where general anesthesia was induced.  The abdomen and flank were prepped and draped in the usual sterile fashion. Attention was turned first to the left SI joint.  A true lateral view of S1 vertebral body was used to obtain the correct starting point and trajectory. The guide pin was advanced through the iliac bone, across the left  SI joint and into the S1 vetebral body. Before advancing the pin we checked its position on the inlet to make sure that it was posterior to the ala and anterior to the canal. We checked the outlet to make sure that it was superior to the S1 foramina and between the vertebral discs.  The guide pin was advanced to the far edge of the vertebral cortex. The pin was measured for length, overdrilled, and then the 90 mm screw with washer inserted. During both pin and screw placement my assistant manipulated the pelvis to assist reduction.  Final images showed appropriate placement and length. Excellent purchase was obtained and the screw was again carefully checked for length and trajectory using inlet, outlet, and lateral views. An RNFA assisted. Wounds were irrigated thoroughly and closed in standard fashion with nylon. Sterile dressings were applied.    PROGNOSIS:  With regard to mobilization, the patient will be PWB 25% on LLE.   Patient is at risk for SI joint arthrosis and pain. Patient will remain on the Trauma Service and may restart pharmacologic  DVT prophylaxis if no other contraindications.  Will follow closely.      Doralee Albino. Carola Frost, M.D.

## 2019-02-14 NOTE — Anesthesia Preprocedure Evaluation (Addendum)
Anesthesia Evaluation  Patient identified by MRN, date of birth, ID band Patient awake    Reviewed: Allergy & Precautions, NPO status , Patient's Chart, lab work & pertinent test results  Airway Mallampati: I  TM Distance: >3 FB Neck ROM: Full    Dental  (+) Teeth Intact, Dental Advisory Given   Pulmonary neg pulmonary ROS,    breath sounds clear to auscultation       Cardiovascular + dysrhythmias Atrial Fibrillation  Rhythm:Regular Rate:Normal     Neuro/Psych negative neurological ROS     GI/Hepatic negative GI ROS, Neg liver ROS,   Endo/Other  negative endocrine ROS  Renal/GU negative Renal ROS     Musculoskeletal negative musculoskeletal ROS (+)   Abdominal Normal abdominal exam  (+)   Peds  Hematology negative hematology ROS (+)   Anesthesia Other Findings   Reproductive/Obstetrics                            Anesthesia Physical Anesthesia Plan  ASA: II  Anesthesia Plan: General   Post-op Pain Management:    Induction: Intravenous  PONV Risk Score and Plan: 4 or greater and Ondansetron, Dexamethasone, Midazolam and Scopolamine patch - Pre-op  Airway Management Planned: Oral ETT  Additional Equipment: None  Intra-op Plan:   Post-operative Plan: Extubation in OR  Informed Consent: I have reviewed the patients History and Physical, chart, labs and discussed the procedure including the risks, benefits and alternatives for the proposed anesthesia with the patient or authorized representative who has indicated his/her understanding and acceptance.     Dental advisory given  Plan Discussed with: CRNA  Anesthesia Plan Comments:         Anesthesia Quick Evaluation

## 2019-02-14 NOTE — Progress Notes (Signed)
OT Cancellation Note  Patient Details Name: Anne Jones MRN: [id number] DOB: [birth year]   Cancelled Treatment:    Reason Eval/Treat Not Completed: Patient at procedure or test/ unavailable. Pt in the OR.  Will check back tomorrow for OT evaluation.   Cipriano Mile OTR/L Acute Rehabilitation Services [phone] 02/14/2019, 11:58 AM
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
## 2019-02-14 NOTE — Anesthesia Procedure Notes (Signed)
Procedure Name: Intubation Date/Time: 02/14/2019 12:12 PM Performed by: Trinna Post., CRNA Pre-anesthesia Checklist: Patient identified, Emergency Drugs available, Suction available, Patient being monitored and Timeout performed Patient Re-evaluated:Patient Re-evaluated prior to induction Oxygen Delivery Method: Circle system utilized Preoxygenation: Pre-oxygenation with 100% oxygen Induction Type: IV induction, Rapid sequence and Cricoid Pressure applied Laryngoscope Size: Mac and 3 Grade View: Grade I Tube type: Oral Tube size: 7.0 mm Number of attempts: 1 Airway Equipment and Method: Stylet Placement Confirmation: ETT inserted through vocal cords under direct vision,  positive ETCO2 and breath sounds checked- equal and bilateral Secured at: 23 cm Tube secured with: Tape Dental Injury: Teeth and Oropharynx as per pre-operative assessment
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
## 2019-02-15 ENCOUNTER — Encounter (HOSPITAL_COMMUNITY): Payer: Self-pay | Admitting: *Deleted

## 2019-02-15 LAB — CBC
HCT: 35.8 % — ABNORMAL LOW (ref 36.0–46.0)
Hemoglobin: 12.3 g/dL (ref 12.0–15.0)
MCH: 29.1 pg (ref 26.0–34.0)
MCHC: 34.4 g/dL (ref 30.0–36.0)
MCV: 84.8 fL (ref 80.0–100.0)
Platelets: 131 10*3/uL — ABNORMAL LOW (ref 150–400)
RBC: 4.22 MIL/uL (ref 3.87–5.11)
RDW: 12.5 % (ref 11.5–15.5)
WBC: 12.3 10*3/uL — ABNORMAL HIGH (ref 4.0–10.5)
nRBC: 0 % (ref 0.0–0.2)

## 2019-02-15 LAB — BASIC METABOLIC PANEL
Anion gap: 8 (ref 5–15)
BUN: 6 mg/dL (ref 6–20)
CO2: 20 mmol/L — ABNORMAL LOW (ref 22–32)
Calcium: 8.9 mg/dL (ref 8.9–10.3)
Chloride: 107 mmol/L (ref 98–111)
Creatinine, Ser: 0.7 mg/dL (ref 0.44–1.00)
GFR calc Af Amer: >60 mL/min (ref >60)
GFR calc non Af Amer: >60 mL/min (ref >60)
Glucose, Bld: 124 mg/dL — ABNORMAL HIGH (ref 70–99)
Potassium: 4.5 mmol/L (ref 3.5–5.1)
Sodium: 135 mmol/L (ref 135–145)

## 2019-02-15 MED ORDER — OXYCODONE HCL 10 MG PO TABS: 10.0000 mg | ORAL_TABLET | ORAL | 0 refills | Status: AC | PRN

## 2019-02-15 MED ORDER — OXYCODONE HCL 10 MG PO TABS: 10.0000 mg | ORAL_TABLET | ORAL | 0 refills | Status: DC | PRN

## 2019-02-15 MED FILL — oxyCODONE HCL 10 MG TABS: 10 | 5 days supply | Qty: 30 | Fill #0
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
## 2019-02-15 NOTE — Anesthesia Postprocedure Evaluation (Signed)
Anesthesia Post Note  Patient: Anne Jones  Procedure(s) Performed: OPEN REDUCTION INTERNAL FIXATION (ORIF) PELVIC FRACTURE (N/A )     Patient location during evaluation: PACU Anesthesia Type: General Level of consciousness: awake and alert Pain management: pain level controlled Vital Signs Assessment: post-procedure vital signs reviewed and stable Respiratory status: spontaneous breathing, nonlabored ventilation, respiratory function stable and patient connected to nasal cannula oxygen Cardiovascular status: blood pressure returned to baseline and stable Postop Assessment: no apparent nausea or vomiting Anesthetic complications: no    Last Vitals:  Vitals:   02/15/19 0828 02/15/19 1348  BP: 104/60 105/63  Pulse: 67 90  Resp: 16 16  Temp: 36.8 C 36.8 C  SpO2: 100% 99%    Last Pain:  Vitals:   02/15/19 1459  TempSrc:   PainSc: 4                  Shelton Silvas
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
## 2019-02-15 NOTE — Progress Notes (Signed)
Pt in a lot of pain while NT was cleaning her up. Pain medicine given. Explained pain regimen to pt, pt verbalized understanding and stated that she is "not concerned with the pain once she gets home." RN reinforced information about pt working with PT so that she can go home today and pt stated that she will have 24/7 care from her husband when she gets home and he will be able to help her with everything.
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
## 2019-02-15 NOTE — Progress Notes (Signed)
Pt given discharge instructions and gone over with her. Pt verbalized understanding of instructions. Pt given ABT ointment for open area on shoulder. Pt's medications delivered to room from pharmacy. Pt waiting on equipment and ride to discharge.
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
## 2019-02-16 LAB — VITAMIN D 25 HYDROXY (VIT D DEFICIENCY, FRACTURES): Vit D, 25-Hydroxy: 24.1 ng/mL — ABNORMAL LOW (ref 30.0–100.0)
